# Patient Record
Sex: Female | Born: 1975 | Race: Black or African American | Hispanic: No | Marital: Single | Smoking: Never smoker
Health system: Southern US, Community
[De-identification: ages and names within clinical notes are randomized; demographics above are authoritative.]

## PROBLEM LIST (undated history)

## (undated) HISTORY — PX: ANKLE SURGERY: SHX546

---

## 2004-04-26 ENCOUNTER — Emergency Department (HOSPITAL_COMMUNITY): Admission: EM | Admit: 2004-04-26 | Discharge: 2004-04-26 | Payer: Self-pay | Admitting: Emergency Medicine

## 2006-11-09 ENCOUNTER — Emergency Department (HOSPITAL_COMMUNITY): Admission: EM | Admit: 2006-11-09 | Discharge: 2006-11-09 | Payer: Self-pay | Admitting: Emergency Medicine

## 2011-10-15 ENCOUNTER — Ambulatory Visit: Payer: Self-pay | Admitting: Urology

## 2011-12-14 ENCOUNTER — Ambulatory Visit: Payer: Self-pay | Admitting: Family Medicine

## 2012-06-04 ENCOUNTER — Emergency Department: Payer: Self-pay | Admitting: Emergency Medicine

## 2012-10-13 ENCOUNTER — Ambulatory Visit: Payer: Self-pay | Admitting: Oncology

## 2012-10-13 LAB — CBC CANCER CENTER
Lymphocyte #: 1.1 x10 3/mm (ref 1.0–3.6)
Lymphocyte %: 24.6 %
MCH: 33.8 pg (ref 26.0–34.0)
MCV: 98 fL (ref 80–100)
Monocyte #: 0.3 x10 3/mm (ref 0.2–0.9)
Monocyte %: 6.1 %
Platelet: 315 x10 3/mm (ref 150–440)

## 2012-10-13 LAB — IRON AND TIBC: Unbound Iron-Bind.Cap.: 275 ug/dL

## 2012-10-13 LAB — FERRITIN: Ferritin (ARMC): 49 ng/mL (ref 8–388)

## 2012-11-12 ENCOUNTER — Ambulatory Visit: Payer: Self-pay | Admitting: Oncology

## 2012-12-16 ENCOUNTER — Ambulatory Visit: Payer: Self-pay | Admitting: Oncology

## 2013-01-06 LAB — CBC CANCER CENTER
Basophil #: 0 x10 3/mm (ref 0.0–0.1)
Lymphocyte #: 1.4 x10 3/mm (ref 1.0–3.6)
Lymphocyte %: 34.1 %
MCHC: 34 g/dL (ref 32.0–36.0)
MCV: 95 fL (ref 80–100)
Monocyte %: 6.7 %
Neutrophil #: 2.3 x10 3/mm (ref 1.4–6.5)
Platelet: 213 x10 3/mm (ref 150–440)
RDW: 11.8 % (ref 11.5–14.5)
WBC: 4 x10 3/mm (ref 3.6–11.0)

## 2013-01-10 ENCOUNTER — Ambulatory Visit: Payer: Self-pay | Admitting: Oncology

## 2013-05-28 DIAGNOSIS — K649 Unspecified hemorrhoids: Secondary | ICD-10-CM | POA: Insufficient documentation

## 2013-10-24 ENCOUNTER — Ambulatory Visit: Payer: Self-pay | Admitting: Obstetrics and Gynecology

## 2013-10-24 LAB — BASIC METABOLIC PANEL
ANION GAP: 3 — AB (ref 7–16)
BUN: 11 mg/dL (ref 7–18)
CHLORIDE: 101 mmol/L (ref 98–107)
CO2: 28 mmol/L (ref 21–32)
CREATININE: 0.81 mg/dL (ref 0.60–1.30)
Calcium, Total: 9.3 mg/dL (ref 8.5–10.1)
EGFR (African American): >60
EGFR (Non-African Amer.): >60
GLUCOSE: 79 mg/dL (ref 65–99)
Osmolality: 263 (ref 275–301)
Potassium: 3.8 mmol/L (ref 3.5–5.1)
SODIUM: 132 mmol/L — AB (ref 136–145)

## 2013-10-24 LAB — HEMOGLOBIN: HGB: 12.9 g/dL (ref 12.0–16.0)

## 2013-10-27 ENCOUNTER — Ambulatory Visit: Payer: Self-pay | Admitting: Obstetrics and Gynecology

## 2013-10-30 LAB — PATHOLOGY REPORT

## 2014-01-16 ENCOUNTER — Emergency Department: Payer: Self-pay | Admitting: Emergency Medicine

## 2014-01-24 ENCOUNTER — Ambulatory Visit: Payer: Self-pay | Admitting: Podiatry

## 2015-02-02 NOTE — Op Note (Signed)
PATIENT NAME:  Jade Dennis, Finlee MR#:  [id number] DATE OF BIRTH:  [birth year]  DATE OF PROCEDURE:  10/27/2013  PREOPERATIVE DIAGNOSIS: Menorrhagia.   POSTOPERATIVE DIAGNOSIS: Atrophic endometrium.   PROCEDURES: 1.  Fractional dilation and curettage.  2.  Hysteroscopy.   SURGEON: Suzy Bouchardhomas J. Angelita Harnack, M.D.   ANESTHESIA: General endotracheal anesthesia.   INDICATION: A 39 year old gravida 0, a patient with menorrhagia since March. The patient elects to retain future fertility.   DESCRIPTION OF PROCEDURE: After adequate general endotracheal anesthesia, the patient was placed in the dorsal supine position with legs in the candy cane stirrups. The patient received 1 gram IV Ancef prior to commencement of the case. The patient's lower abdomen, vagina, and perineum were prepped in normal sterile fashion. A Red Robinson catheter drained the bladder with 50 mL output. A weighted speculum was placed in the posterior vaginal vault and the anterior cervix was then grasped with a single-tooth tenaculum. The uterus sounds to 8 cm. Endocervical curettage was performed then followed by dilation of the cervix with a #18 Hanks dilator. The hysteroscope was then advanced into the endometrial cavity without difficulty. Lactated Ringer's was used as the distending medium. The endometrial cavity appeared atrophic with no lesions, no polyps, no submucosal fibroids identified. The fallopian tube ostia appeared normal bilaterally. Endometrial curettage was then performed with scant tissue removed. Good hemostasis noted. The single-tooth tenaculum was removed with a small amount of oozing from the tenacula site. This was controlled with silver nitrate. There were no complications. Estimated blood loss minimal. Intraoperative fluids 400 mL. Urine output 50 mL. The patient was taken to the recovery room in good condition. ____________________________ Suzy Bouchardhomas J. Paulla Mcclaskey, MD tjs:sb D: 10/27/2013 08:18:54  ET T: 10/27/2013 08:31:35 ET JOB#: [id number]  cc: Suzy Bouchardhomas J. Zsofia Prout, MD, <Dictator> Suzy BouchardHOMAS J Hance Caspers MD ELECTRONICALLY SIGNED 10/28/2013 10:24

## 2015-06-29 IMAGING — CR RIGHT ANKLE - COMPLETE 3+ VIEW
1 series · 3 of 3 positions shown · non-contrast
Comparison: None.

CLINICAL DATA: Diffuse ankle pain secondary to an injury 3 days
ago.

EXAM:
RIGHT ANKLE - COMPLETE 3+ VIEW

[Series 1: x ankle ap right · 0.14mm/px · 3 of 3 slices shown]
[im 1/3]
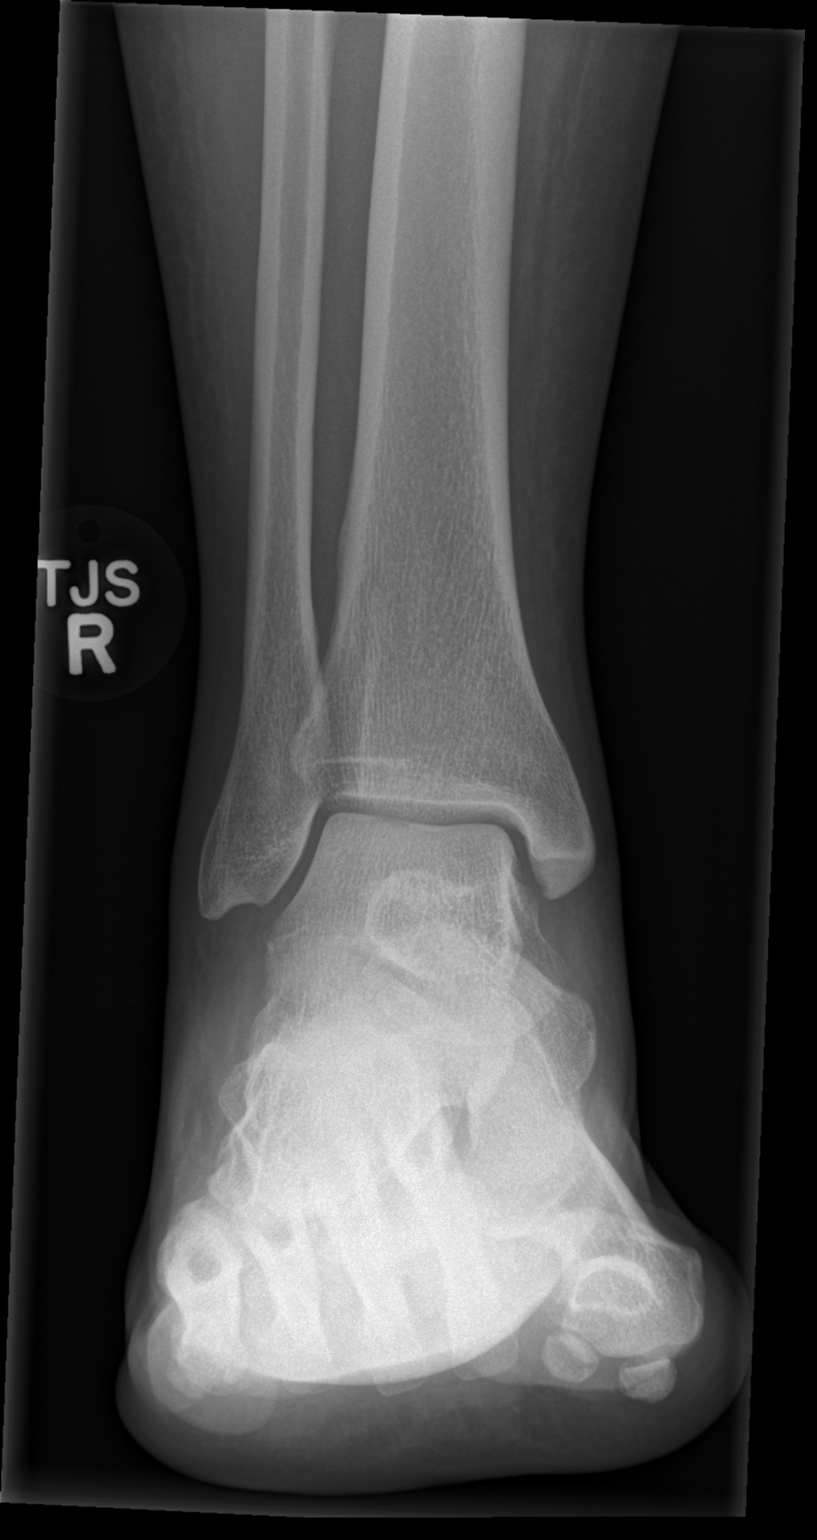
[im 2/3]
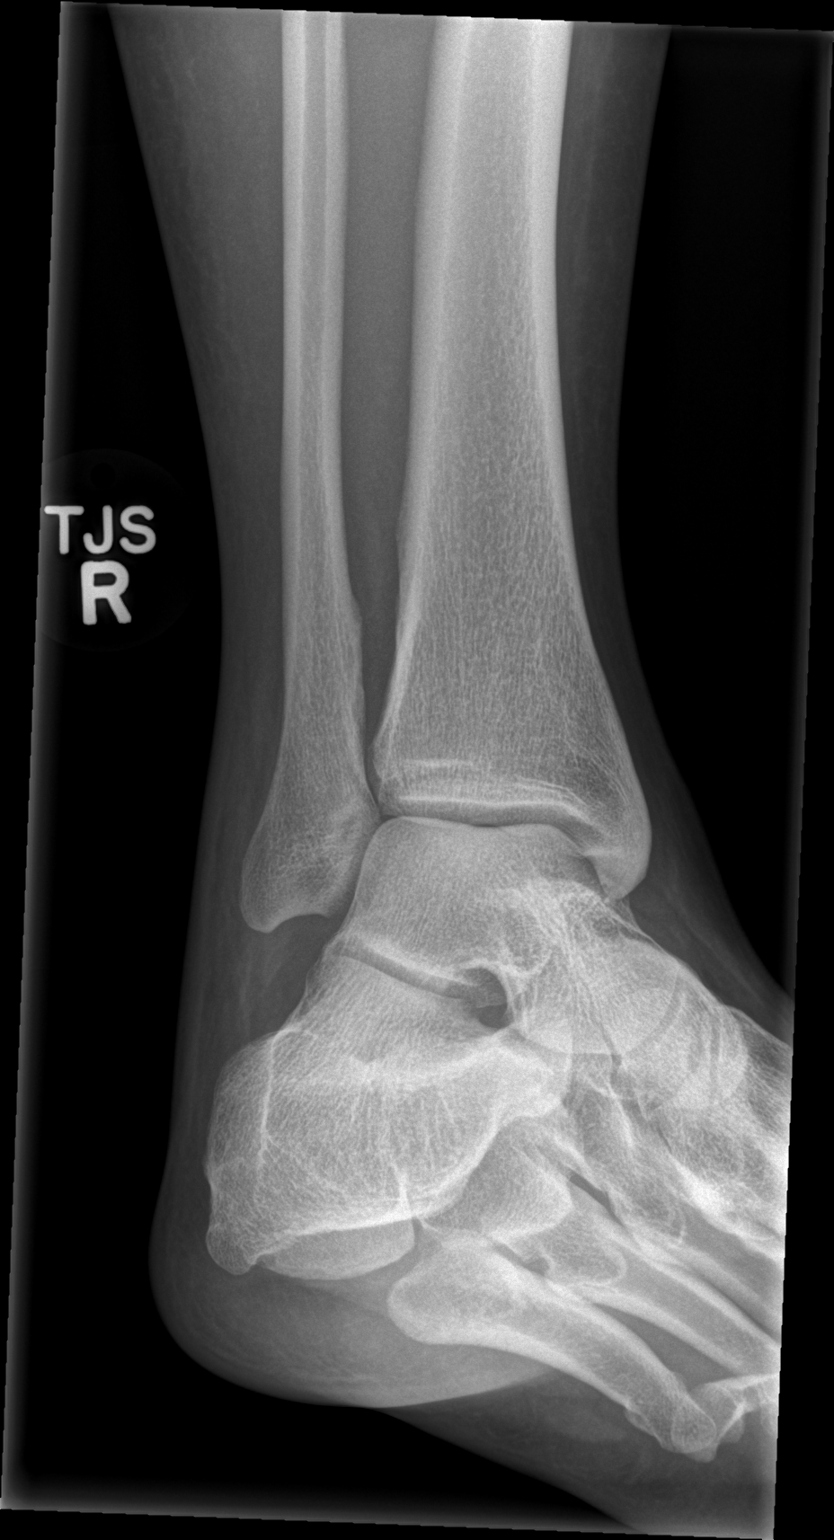
[im 3/3]
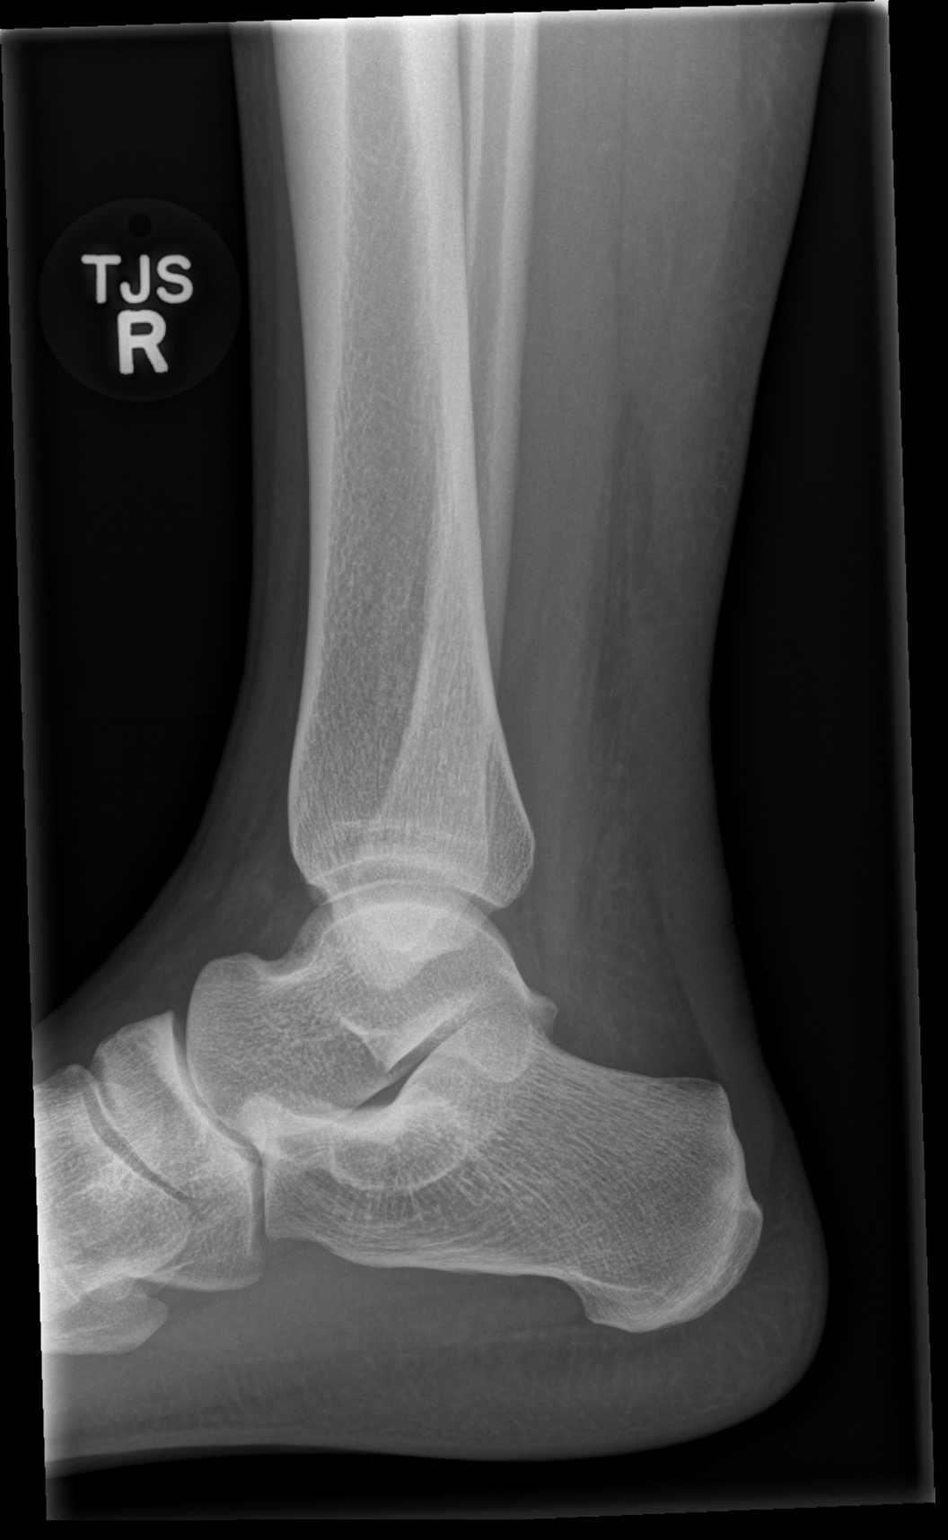

[3 of 3 positions shown; findings below may reference images not displayed]

FINDINGS: There is no fracture or dislocation or joint effusion. The bones are
normal. There is edema in the soft tissues of the posterior aspect
of the distal calf, nonspecific.
IMPRESSION: No osseous abnormality. Edema in the soft tissues of the posterior
distal calf.

## 2016-02-18 ENCOUNTER — Other Ambulatory Visit: Payer: Self-pay | Admitting: Obstetrics and Gynecology

## 2016-02-18 DIAGNOSIS — Z1231 Encounter for screening mammogram for malignant neoplasm of breast: Secondary | ICD-10-CM

## 2016-03-04 ENCOUNTER — Ambulatory Visit
Admission: RE | Admit: 2016-03-04 | Discharge: 2016-03-04 | Disposition: A | Discharge status: Home / Self Care | Payer: No Typology Code available for payment source | Source: Ambulatory Visit | Attending: Obstetrics and Gynecology | Admitting: Obstetrics and Gynecology

## 2016-03-04 DIAGNOSIS — Z1231 Encounter for screening mammogram for malignant neoplasm of breast: Secondary | ICD-10-CM | POA: Insufficient documentation

## 2017-04-04 ENCOUNTER — Emergency Department
Admission: EM | Admit: 2017-04-04 | Discharge: 2017-04-04 | Disposition: A | Discharge status: Home / Self Care | Payer: No Typology Code available for payment source | Attending: Emergency Medicine | Admitting: Emergency Medicine

## 2017-04-04 ENCOUNTER — Encounter: Payer: Self-pay | Admitting: Emergency Medicine

## 2017-04-04 ENCOUNTER — Inpatient Hospital Stay (HOSPITAL_COMMUNITY): Admit: 2017-04-04 | Payer: Self-pay

## 2017-04-04 ENCOUNTER — Ambulatory Visit
Admission: EM | Admit: 2017-04-04 | Discharge: 2017-04-04 | Disposition: A | Discharge status: Home / Self Care | Payer: No Typology Code available for payment source | Attending: Emergency Medicine | Admitting: Emergency Medicine

## 2017-04-04 DIAGNOSIS — S20319A Abrasion of unspecified front wall of thorax, initial encounter: Secondary | ICD-10-CM | POA: Diagnosis not present

## 2017-04-04 DIAGNOSIS — S2091XA Abrasion of unspecified parts of thorax, initial encounter: Secondary | ICD-10-CM | POA: Insufficient documentation

## 2017-04-04 DIAGNOSIS — Y998 Other external cause status: Secondary | ICD-10-CM | POA: Insufficient documentation

## 2017-04-04 DIAGNOSIS — X58XXXA Exposure to other specified factors, initial encounter: Secondary | ICD-10-CM | POA: Insufficient documentation

## 2017-04-04 DIAGNOSIS — T7421XA Adult sexual abuse, confirmed, initial encounter: Secondary | ICD-10-CM | POA: Insufficient documentation

## 2017-04-04 DIAGNOSIS — Z0441 Encounter for examination and observation following alleged adult rape: Secondary | ICD-10-CM | POA: Insufficient documentation

## 2017-04-04 DIAGNOSIS — Y929 Unspecified place or not applicable: Secondary | ICD-10-CM | POA: Insufficient documentation

## 2017-04-04 DIAGNOSIS — Y939 Activity, unspecified: Secondary | ICD-10-CM | POA: Insufficient documentation

## 2017-04-04 MED ORDER — ULIPRISTAL ACETATE 30 MG PO TABS
30.0000 mg | ORAL_TABLET | Freq: Once | ORAL | Status: AC
  Administered 2017-04-04: 30 mg via ORAL

## 2017-04-04 MED ORDER — ULIPRISTAL ACETATE 30 MG PO TABS
ORAL_TABLET | ORAL | Status: AC
  Filled 2017-04-04: qty 1

## 2017-04-04 MED ORDER — LIDOCAINE HCL (PF) 1 % IJ SOLN: 0.9000 mL | Freq: Once | INTRAMUSCULAR | Status: DC

## 2017-04-04 MED ORDER — CEFTRIAXONE SODIUM 250 MG IJ SOLR
250.0000 mg | Freq: Once | INTRAMUSCULAR | Status: AC
  Administered 2017-04-04: 250 mg via INTRAMUSCULAR

## 2017-04-04 MED ORDER — LIDOCAINE HCL (PF) 1 % IJ SOLN
INTRAMUSCULAR | Status: AC
  Administered 2017-04-04: 19:00:00
  Filled 2017-04-04: qty 5

## 2017-04-04 MED ORDER — CEFTRIAXONE SODIUM 250 MG IJ SOLR
INTRAMUSCULAR | Status: AC
  Filled 2017-04-04: qty 250

## 2017-04-04 MED ORDER — AZITHROMYCIN 500 MG PO TABS
1000.0000 mg | ORAL_TABLET | Freq: Once | ORAL | Status: AC
Start: 2017-04-04 — End: 2017-04-04
  Administered 2017-04-04: 1000 mg via ORAL
  Filled 2017-04-04: qty 2

## 2017-04-04 MED ORDER — PROMETHAZINE HCL 25 MG PO TABS
25.0000 mg | ORAL_TABLET | Freq: Four times a day (QID) | ORAL | Status: DC | PRN
  Administered 2017-04-04: 25 mg via ORAL

## 2017-04-04 MED ORDER — AZITHROMYCIN 500 MG PO TABS
ORAL_TABLET | ORAL | Status: AC
  Filled 2017-04-04: qty 2

## 2017-04-04 MED ORDER — METRONIDAZOLE 500 MG PO TABS
2000.0000 mg | ORAL_TABLET | Freq: Once | ORAL | Status: AC
  Administered 2017-04-04: 2000 mg via ORAL

## 2017-04-04 NOTE — Discharge Instructions (Signed)
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 sexual contact until you have finished your treatment. Having sex can infect your sexual partner. Calcium may bind to this medicine and cause lung or kidney problems. Avoid calcium products while taking this medicine and for 48 hours after taking the last dose of this medicine. What side effects may I notice from receiving this medicine? Side effects that you should report to your doctor or health care professional as soon as possible: -allergic reactions like skin rash, itching or hives, swelling of the face, lips, or tongue -breathing problems -fever, chills -irregular heartbeat -pain when passing urine -seizures -stomach pain, cramps -unusual bleeding, bruising -unusually weak or tired Side effects that usually do not require medical  attention (report to your doctor or health care professional if they continue or are bothersome): -diarrhea -dizzy, drowsy -headache -nausea, vomiting -pain, swelling, irritation where injected -stomach upset -sweating This list may not describe all possible side effects.  Call your doctor for medical advice about side effects. You may report side effects to FDA at 1-800-FDA-1088. Where should I keep my medicine? Keep out of the reach of children. Store at room temperature below 25 degrees C (77 degrees F). Protect from light. Throw away any unused vials after the expiration date. NOTE: This sheet is a summary. It may not cover all possible information. If you have questions about this medicine, talk to your doctor, pharmacist, or health care provider.  2017 Elsevier/Gold Standard (2014-04-16 09:14:54)  Promethazine (pack of 3 for home use) Also known as:  Phenergan  Promethazine tablets What is this medicine? PROMETHAZINE (proe METH a zeen) is an antihistamine. It is used to treat allergic reactions and to treat or prevent nausea and vomiting from illness or motion sickness. It is also used to make you sleep before surgery, and to help treat pain or nausea after surgery. This medicine may be used for other purposes; ask your health care provider or pharmacist if you have questions. COMMON BRAND NAME(S): Phenergan What should I tell my health care provider before I take this medicine? They need to know if you have any of these conditions: -glaucoma -high blood pressure or heart disease -kidney disease -liver disease -lung or breathing disease, like asthma -prostate trouble -pain or difficulty passing urine -seizures -an unusual or allergic reaction to promethazine or phenothiazines, other medicines, foods, dyes, or preservatives -pregnant or trying to get pregnant -breast-feeding How should I use this medicine? Take this medicine by mouth with a glass of water. Follow the directions on the prescription label. Take your doses at regular intervals. Do not take your medicine more often than directed. Talk to your pediatrician regarding the use of this medicine in children. Special care may be needed. This medicine should not be given to infants and children  younger than 66 years old. Overdosage: If you think you have taken too much of this medicine contact a poison control center or emergency room at once. NOTE: This medicine is only for you. Do not share this medicine with others. What if I miss a dose? If you miss a dose, take it as soon as you can. If it is almost time for your next dose, take only that dose. Do not take double or extra doses. What may interact with this medicine? Do not take this medicine with any of the following medications: -cisapride -dofetilide -dronedarone -MAOIs like Carbex, Eldepryl, Marplan, Nardil, Parnate -pimozide -quinidine, including dextromethorphan; quinidine -thioridazine -ziprasidone This medicine may also interact with the following medications: -certain medicines for depression, anxiety, or psychotic disturbances -certain medicines for anxiety or sleep -certain medicines for seizures like carbamazepine, phenobarbital, phenytoin -certain medicines for movement abnormalities as in Parkinson's disease, or for gastrointestinal problems -epinephrine -medicines for allergies or colds -muscle relaxants -narcotic medicines for pain -other medicines that prolong the QT interval (cause an abnormal heart rhythm) -tramadol -trimethobenzamide This list may not describe all possible interactions. Give your health care provider a list of all the medicines, herbs, non-prescription drugs, or dietary supplements you use. Also tell them if you smoke, drink alcohol, or use illegal drugs. Some items may interact with your medicine. What should I watch for while using this medicine? Tell your doctor or health care professional  if your symptoms do not start to get better in 1 to 2 days. You may get drowsy or dizzy. Do not drive, use machinery, or do anything that needs mental alertness until you know how this medicine affects you. To reduce the risk of dizzy or fainting spells, do not stand or sit up quickly, especially if  you are an older patient. Alcohol may increase dizziness and drowsiness. Avoid alcoholic drinks. Your mouth may get dry. Chewing sugarless gum or sucking hard candy, and drinking plenty of water may help. Contact your doctor if the problem does not go away or is severe. This medicine may cause dry eyes and blurred vision. If you wear contact lenses you may feel some discomfort. Lubricating drops may help. See your eye doctor if the problem does not go away or is severe. This medicine can make you more sensitive to the sun. Keep out of the sun. If you cannot avoid being in the sun, wear protective clothing and use sunscreen. Do not use sun lamps or tanning beds/booths. If you are diabetic, check your blood-sugar levels regularly. What side effects may I notice from receiving this medicine? Side effects that you should report to your doctor or health care professional as soon as possible: -blurred vision -irregular heartbeat, palpitations or chest pain -muscle or facial twitches -pain or difficulty passing urine -seizures -skin rash -slowed or shallow breathing -unusual bleeding or bruising -yellowing of the eyes or skin Side effects that usually do not require medical attention (report to your doctor or health care professional if they continue or are bothersome): -headache -nightmares, agitation, nervousness, excitability, not able to sleep (these are more likely in children) -stuffy nose This list may not describe all possible side effects. Call your doctor for medical advice about side effects. You may report side effects to FDA at 1-800-FDA-1088. Where should I keep my medicine? Keep out of the reach of children. Store at room temperature, between 20 and 25 degrees C (68 and 77 degrees F). Protect from light. Throw away any unused medicine after the expiration date. NOTE: This sheet is a summary. It may not cover all possible information. If you have questions about this medicine, talk to  your doctor, pharmacist, or health care provider.  2017 Elsevier/Gold Standard (2013-05-30 15:04:46)
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
## 2017-04-04 NOTE — ED Notes (Signed)
Pts father to ED requesting information. RN asked pt if father could be informed that she was in ED and would be in exam room for the time being. PT verbalized consent and father was updated and informed to wait in the lobby.
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
## 2017-04-05 LAB — POC URINE PREG, ED: PREG TEST UR: NEGATIVE

## 2017-04-05 LAB — POCT PREGNANCY, URINE: PREG TEST UR: NEGATIVE

## 2017-04-05 NOTE — SANE Note (Signed)
On 04/04/2017@ 1855, this RN gave the patient her discharge instructions and reviewed them.  Patient verbalizes understanding and denies having any questions or concerns at this time.
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
## 2017-04-05 NOTE — ED Notes (Signed)
NEGATIVE PREGNANCY

## 2017-04-05 NOTE — SANE Note (Signed)
Attempted to contact the patient, voicemail left
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
## 2020-02-28 ENCOUNTER — Ambulatory Visit: Payer: Self-pay | Admitting: Obstetrics and Gynecology

## 2020-02-28 ENCOUNTER — Encounter: Payer: Self-pay | Admitting: Obstetrics and Gynecology

## 2020-02-28 ENCOUNTER — Other Ambulatory Visit: Payer: Self-pay

## 2020-02-28 ENCOUNTER — Ambulatory Visit (INDEPENDENT_AMBULATORY_CARE_PROVIDER_SITE_OTHER): Payer: No Typology Code available for payment source | Admitting: Obstetrics and Gynecology

## 2020-02-28 VITALS — BP 124/74 | Ht 67.0 in | Wt 149.0 lb

## 2020-02-28 DIAGNOSIS — Z5329 Procedure and treatment not carried out because of patient's decision for other reasons: Secondary | ICD-10-CM

## 2020-03-01 ENCOUNTER — Ambulatory Visit: Payer: No Typology Code available for payment source | Admitting: Obstetrics and Gynecology

## 2020-03-29 ENCOUNTER — Ambulatory Visit: Payer: No Typology Code available for payment source | Admitting: Obstetrics and Gynecology

## 2020-05-06 ENCOUNTER — Ambulatory Visit: Payer: No Typology Code available for payment source | Admitting: Obstetrics and Gynecology

## 2020-05-22 ENCOUNTER — Ambulatory Visit: Payer: No Typology Code available for payment source | Admitting: Obstetrics and Gynecology

## 2020-06-27 ENCOUNTER — Other Ambulatory Visit: Payer: Self-pay

## 2020-06-27 ENCOUNTER — Other Ambulatory Visit (HOSPITAL_COMMUNITY)
Admission: RE | Admit: 2020-06-27 | Discharge: 2020-06-27 | Disposition: A | Discharge status: Home / Self Care | Payer: No Typology Code available for payment source | Source: Ambulatory Visit | Attending: Obstetrics and Gynecology | Admitting: Obstetrics and Gynecology

## 2020-06-27 ENCOUNTER — Encounter: Payer: Self-pay | Admitting: Obstetrics and Gynecology

## 2020-06-27 ENCOUNTER — Ambulatory Visit (INDEPENDENT_AMBULATORY_CARE_PROVIDER_SITE_OTHER): Payer: No Typology Code available for payment source | Admitting: Obstetrics and Gynecology

## 2020-06-27 VITALS — BP 100/70 | Ht 67.0 in | Wt 146.0 lb

## 2020-06-27 DIAGNOSIS — Z1211 Encounter for screening for malignant neoplasm of colon: Secondary | ICD-10-CM

## 2020-06-27 DIAGNOSIS — Z01419 Encounter for gynecological examination (general) (routine) without abnormal findings: Secondary | ICD-10-CM | POA: Diagnosis not present

## 2020-06-27 DIAGNOSIS — Z1239 Encounter for other screening for malignant neoplasm of breast: Secondary | ICD-10-CM | POA: Diagnosis not present

## 2020-06-27 DIAGNOSIS — Z124 Encounter for screening for malignant neoplasm of cervix: Secondary | ICD-10-CM | POA: Diagnosis present

## 2020-06-27 DIAGNOSIS — Z1329 Encounter for screening for other suspected endocrine disorder: Secondary | ICD-10-CM

## 2020-06-27 DIAGNOSIS — N951 Menopausal and female climacteric states: Secondary | ICD-10-CM

## 2020-06-27 NOTE — Patient Instructions (Signed)
[facility] [address] [place] [address]  [facility] [place]  [address] [place] [address]  Phone: [phone]  

## 2020-06-27 NOTE — Progress Notes (Signed)
Gynecology Annual Exam  PCP: Steele Sizer, MD  Chief Complaint:  Chief Complaint  Patient presents with  . Gynecologic Exam    History of Present Illness: Patient is a 44 y.o. G0P0000 presents for annual exam. The patient has no complaints today.   LMP: Patient's last menstrual period was 06/23/2020 (exact date). Average Interval: monthly Duration of flow: 6 days Heavy Menses: no Clots: no Intermenstrual Bleeding: no Postcoital Bleeding: no Dysmenorrhea: no Last cycle was significantly shorter and lighter.  Has started to note some vasomotor symptoms.  The patient is not currently sexually active.  The patient does perform self breast exams.  There is no known family history of breast or ovarian cancer in her family (adopted).  The patient wears seatbelts: yes.   The patient has regular exercise: not asked.    The patient denies current symptoms of depression.    Review of Systems: Review of Systems  Constitutional: Negative for chills and fever.  HENT: Negative for congestion.   Respiratory: Negative for cough and shortness of breath.   Cardiovascular: Negative for chest pain and palpitations.  Gastrointestinal: Negative for abdominal pain, constipation, diarrhea, heartburn, nausea and vomiting.  Genitourinary: Negative for dysuria, frequency and urgency.  Skin: Negative for itching and rash.  Neurological: Negative for dizziness and headaches.  Endo/Heme/Allergies: Negative for polydipsia.  Psychiatric/Behavioral: Negative for depression.    Past Medical History:  There are no problems to display for this patient.   Past Surgical History:  Past Surgical History:  Procedure Laterality Date  . ANKLE SURGERY     right achilles tendon    Gynecologic History:  Patient's last menstrual period was 06/23/2020 (exact date). Contraception: abstinence  Obstetric History: G0P0000  Family History:  History reviewed. No pertinent family history.  Social  History:  Social History   Socioeconomic History  . Marital status: Single    Spouse name: Not on file  . Number of children: Not on file  . Years of education: Not on file  . Highest education level: Not on file  Occupational History  . Not on file  Tobacco Use  . Smoking status: Never Smoker  . Smokeless tobacco: Never Used  Vaping Use  . Vaping Use: Never used  Substance and Sexual Activity  . Alcohol use: Yes  . Drug use: Never  . Sexual activity: Not Currently    Birth control/protection: None  Other Topics Concern  . Not on file  Social History Narrative  . Not on file   Social Determinants of Health   Financial Resource Strain:   . Difficulty of Paying Living Expenses: Not on file  Food Insecurity:   . Worried About Programme researcher, broadcasting/film/video in the Last Year: Not on file  . Ran Out of Food in the Last Year: Not on file  Transportation Needs:   . Lack of Transportation (Medical): Not on file  . Lack of Transportation (Non-Medical): Not on file  Physical Activity:   . Days of Exercise per Week: Not on file  . Minutes of Exercise per Session: Not on file  Stress:   . Feeling of Stress : Not on file  Social Connections:   . Frequency of Communication with Friends and Family: Not on file  . Frequency of Social Gatherings with Friends and Family: Not on file  . Attends Religious Services: Not on file  . Active Member of Clubs or Organizations: Not on file  . Attends Banker Meetings: Not on  file  . Marital Status: Not on file  Intimate Partner Violence:   . Fear of Current or Ex-Partner: Not on file  . Emotionally Abused: Not on file  . Physically Abused: Not on file  . Sexually Abused: Not on file    Allergies:  No Known Allergies  Medications: Prior to Admission medications   Medication Sig Start Date End Date Taking? Authorizing Provider  Multiple Vitamin (MULTI-VITAMIN) tablet Take 1 tablet by mouth daily.   Yes [provider]     Physical Exam Vitals: Blood pressure 100/70, height 5\' 7"  (1.702 m), weight 146 lb (66.2 kg), last menstrual period 06/23/2020.  General: NAD HEENT: normocephalic, anicteric Thyroid: no enlargement, no palpable nodules Pulmonary: No increased work of breathing, CTAB Cardiovascular: RRR, distal pulses 2+ Breast: Breast symmetrical, no tenderness, no palpable nodules or masses, no skin or nipple retraction present, no nipple discharge.  No axillary or supraclavicular lymphadenopathy. Abdomen: NABS, soft, non-tender, non-distended.  Umbilicus without lesions.  No hepatomegaly, splenomegaly or masses palpable. No evidence of hernia  Genitourinary:  External: Normal external female genitalia.  Normal urethral meatus, normal Bartholin's and Skene's glands.    Vagina: Normal vaginal mucosa, no evidence of prolapse.    Cervix: Grossly normal in appearance, no bleeding  Uterus: Non-enlarged, mobile, normal contour.  No CMT  Adnexa: ovaries non-enlarged, no adnexal masses  Rectal: deferred  Lymphatic: no evidence of inguinal lymphadenopathy Extremities: no edema, erythema, or tenderness Neurologic: Grossly intact Psychiatric: mood appropriate, affect full  Female chaperone present for pelvic and breast  portions of the physical exam    Assessment: 45 y.o. G0P0000 routine annual exam  Plan: Problem List Items Addressed This Visit    None    Visit Diagnoses    Encounter for gynecological examination without abnormal finding    -  Primary   Screening for malignant neoplasm of cervix       Relevant Orders   Cytology - PAP   Breast screening       Relevant Orders   MM 3D SCREEN BREAST BILATERAL   Colon cancer screening       Relevant Orders   Ambulatory referral to Gastroenterology   Vasomotor symptoms due to menopause       Relevant Orders   FSH   Estradiol   Thyroid disorder screening       Relevant Orders   Thyroid Panel With TSH      1) Mammogram - recommend yearly  screening mammogram.  Mammogram Was ordered today   2) STI screening  was offered and declined  3) ASCCP guidelines and rational discussed.  Patient opts for every 3 years screening interval  4) Contraception - the patient is currently using  abstinence.  She is happy with her current form of contraception and plans to continue  5) Colonoscopy -- Screening recommended starting at age 23 for average risk individuals, age 36 for individuals deemed at increased risk (including African Americans) and recommended to continue until age 54.  For patient age 48-85 individualized approach is recommended.  Gold standard screening is via colonoscopy, Cologuard screening is an acceptable alternative for patient unwilling or unable to undergo colonoscopy.  "Colorectal cancer screening for average?risk adults: 2018 guideline update from the American Cancer Society"[place]: A Cancer Journal for Clinicians: Mar 10, 2017   6) Routine healthcare maintenance including cholesterol, diabetes screening discussed managed by PCP  7) Return in about 1 year (around 06/27/2021) for annual.   06/29/2021, MD, [facility],  [facility] 06/27/2020, 2:06 PM

## 2020-06-28 LAB — THYROID PANEL WITH TSH
Free Thyroxine Index: 2.1 (ref 1.2–4.9)
T3 Uptake Ratio: 35 % (ref 24–39)
T4, Total: 6.1 ug/dL (ref 4.5–12.0)
TSH: 1.72 u[IU]/mL (ref 0.450–4.500)

## 2020-06-28 LAB — ESTRADIOL: Estradiol: 57.5 pg/mL

## 2020-06-28 LAB — FOLLICLE STIMULATING HORMONE: FSH: 5.8 m[IU]/mL

## 2020-07-01 LAB — CYTOLOGY - PAP
Comment: NEGATIVE
Diagnosis: NEGATIVE
High risk HPV: NEGATIVE

## 2020-07-11 NOTE — Telephone Encounter (Signed)
Patient is scheduled on 07/29/20 with [facility]MS for preconception counseling.

## 2020-07-29 ENCOUNTER — Ambulatory Visit (INDEPENDENT_AMBULATORY_CARE_PROVIDER_SITE_OTHER): Payer: No Typology Code available for payment source | Admitting: Obstetrics and Gynecology

## 2020-07-29 ENCOUNTER — Other Ambulatory Visit: Payer: Self-pay

## 2020-07-29 VITALS — BP 115/70 | Ht 67.0 in | Wt 148.2 lb

## 2020-07-29 DIAGNOSIS — Z3169 Encounter for other general counseling and advice on procreation: Secondary | ICD-10-CM

## 2020-07-29 NOTE — Progress Notes (Signed)
Obstetrics & Gynecology Office Visit   Chief Complaint: Preconception Consult  History of Present Illness: Patient is a 44 y.o. G0P0000 interested in pursuing pregnancy in the near future.  She reports regular menstrual periods, and is currently using none for contraception.  No prior pregnancies.  Her past medical history is non-contributory.    The patient is adopted so no contributing family history if known.    Review of Systems: Review of Systems  Constitutional: Negative.   Gastrointestinal: Negative.   Genitourinary: Negative.     Past Medical History:  Patient Active Problem List   Diagnosis Date Noted  . Hemorrhoids 05/28/2013    Past Surgical History:  Past Surgical History:  Procedure Laterality Date  . ANKLE SURGERY     right achilles tendon    Gynecologic History: Patient's last menstrual period was 07/18/2020.  Obstetric History: G0P0000  Family History:  No family history on file.  Social History:  Social History   Socioeconomic History  . Marital status: Single    Spouse name: Not on file  . Number of children: Not on file  . Years of education: Not on file  . Highest education level: Not on file  Occupational History  . Not on file  Tobacco Use  . Smoking status: Never Smoker  . Smokeless tobacco: Never Used  Vaping Use  . Vaping Use: Never used  Substance and Sexual Activity  . Alcohol use: Yes  . Drug use: Never  . Sexual activity: Not Currently    Birth control/protection: None  Other Topics Concern  . Not on file  Social History Narrative  . Not on file   Social Determinants of Health   Financial Resource Strain:   . Difficulty of Paying Living Expenses: Not on file  Food Insecurity:   . Worried About Programme researcher, broadcasting/film/video in the Last Year: Not on file  . Ran Out of Food in the Last Year: Not on file  Transportation Needs:   . Lack of Transportation (Medical): Not on file  . Lack of Transportation (Non-Medical): Not on file    Physical Activity:   . Days of Exercise per Week: Not on file  . Minutes of Exercise per Session: Not on file  Stress:   . Feeling of Stress : Not on file  Social Connections:   . Frequency of Communication with Friends and Family: Not on file  . Frequency of Social Gatherings with Friends and Family: Not on file  . Attends Religious Services: Not on file  . Active Member of Clubs or Organizations: Not on file  . Attends Banker Meetings: Not on file  . Marital Status: Not on file  Intimate Partner Violence:   . Fear of Current or Ex-Partner: Not on file  . Emotionally Abused: Not on file  . Physically Abused: Not on file  . Sexually Abused: Not on file    Allergies:  No Known Allergies  Medications: Prior to Admission medications   Medication Sig Start Date End Date Taking? Authorizing Provider  Multiple Vitamin (MULTI-VITAMIN) tablet Take 1 tablet by mouth daily.    [provider]    Physical Exam Vitals:  Vitals:   07/29/20 1619  BP: 115/70   Patient's last menstrual period was 07/18/2020.  General: NAD, well nourished appears stated age HEENT: normocephalic, anicteric Pulmonary: No increased work of breathing Neurologic: Grossly intact Psychiatric: mood appropriate, affect full   Assessment: 44 y.o. G0P0000 presenting for preconception counseling  Plan:  Problem List Items Addressed This Visit    None    Visit Diagnoses    Encounter for preconception consultation    -  Primary   Relevant Orders   Anti mullerian hormone      1) Odds of spontanous conception - discused that the odds of spontanous viable conception at age 50 it exceedingly small.  Based on IVF data the chances of an IVF cycle resulting in a live birth was approximately 3-5% for women age 50-44%, and less than 1% for women over age 4 ACOG Committee Opinion Number 589 March 2014 Reaffirmed 2020 "Female Age-Related Fertility Decline"  - [facility] sent but suspect will be  low - Should AMH be normal, I discussed that best chances of conception would be with donor egg.  4) A total of 20 minutes were spent in face-to-face contact with the patient during this encounter with over half of that time devoted to counseling and coordination of care.  5) Return lab sometime later this week.   Vena Austria, MD, [facility], [facility] 07/29/2020, 4:14 PM

## 2020-07-30 ENCOUNTER — Other Ambulatory Visit: Payer: No Typology Code available for payment source

## 2020-07-30 DIAGNOSIS — Z3169 Encounter for other general counseling and advice on procreation: Secondary | ICD-10-CM

## 2020-08-02 ENCOUNTER — Telehealth: Payer: Self-pay | Admitting: Obstetrics and Gynecology

## 2020-08-02 NOTE — Telephone Encounter (Signed)
Patient is calling to follow up on lab results. Please advise . Patient aware AMS out of out and back up call this weekend

## 2020-08-04 LAB — ANTI MULLERIAN HORMONE: ANTI-MULLERIAN HORMONE (AMH): 1.62 ng/mL
